# Patient Record
Sex: Male | Born: 1995 | Smoking: Never smoker
Health system: Southern US, Community
[De-identification: ages and names within clinical notes are randomized; demographics above are authoritative.]

## PROBLEM LIST (undated history)

## (undated) DIAGNOSIS — Z973 Presence of spectacles and contact lenses: Secondary | ICD-10-CM

## (undated) DIAGNOSIS — R519 Headache, unspecified: Secondary | ICD-10-CM

## (undated) DIAGNOSIS — K589 Irritable bowel syndrome without diarrhea: Secondary | ICD-10-CM

## (undated) DIAGNOSIS — R51 Headache: Secondary | ICD-10-CM

## (undated) DIAGNOSIS — K219 Gastro-esophageal reflux disease without esophagitis: Secondary | ICD-10-CM

## (undated) DIAGNOSIS — M79604 Pain in right leg: Secondary | ICD-10-CM

## (undated) HISTORY — PX: SIGMOIDOSCOPY: SUR1295

## (undated) HISTORY — PX: ESOPHAGOGASTRODUODENOSCOPY: SHX1529

---

## 2018-04-29 ENCOUNTER — Other Ambulatory Visit: Payer: Self-pay | Admitting: Orthopedic Surgery

## 2018-05-01 ENCOUNTER — Encounter (HOSPITAL_COMMUNITY)
Admission: RE | Admit: 2018-05-01 | Discharge: 2018-05-01 | Disposition: A | Discharge status: Home / Self Care | Source: Ambulatory Visit | Attending: Orthopedic Surgery | Admitting: Orthopedic Surgery

## 2018-05-01 ENCOUNTER — Ambulatory Visit (HOSPITAL_COMMUNITY)
Admission: RE | Admit: 2018-05-01 | Discharge: 2018-05-01 | Disposition: A | Discharge status: Home / Self Care | Payer: Self-pay | Source: Ambulatory Visit | Attending: Orthopedic Surgery | Admitting: Orthopedic Surgery

## 2018-05-01 ENCOUNTER — Encounter (HOSPITAL_COMMUNITY): Payer: Self-pay

## 2018-05-01 ENCOUNTER — Other Ambulatory Visit: Payer: Self-pay

## 2018-05-01 DIAGNOSIS — Z8249 Family history of ischemic heart disease and other diseases of the circulatory system: Secondary | ICD-10-CM | POA: Diagnosis not present

## 2018-05-01 DIAGNOSIS — M5116 Intervertebral disc disorders with radiculopathy, lumbar region: Secondary | ICD-10-CM | POA: Diagnosis not present

## 2018-05-01 DIAGNOSIS — Z79899 Other long term (current) drug therapy: Secondary | ICD-10-CM | POA: Insufficient documentation

## 2018-05-01 DIAGNOSIS — M79604 Pain in right leg: Secondary | ICD-10-CM | POA: Diagnosis present

## 2018-05-01 DIAGNOSIS — K219 Gastro-esophageal reflux disease without esophagitis: Secondary | ICD-10-CM | POA: Insufficient documentation

## 2018-05-01 DIAGNOSIS — Z01818 Encounter for other preprocedural examination: Secondary | ICD-10-CM | POA: Insufficient documentation

## 2018-05-01 DIAGNOSIS — Z6837 Body mass index (BMI) 37.0-37.9, adult: Secondary | ICD-10-CM | POA: Diagnosis not present

## 2018-05-01 HISTORY — DX: Gastro-esophageal reflux disease without esophagitis: K21.9

## 2018-05-01 HISTORY — DX: Headache: R51

## 2018-05-01 HISTORY — DX: Presence of spectacles and contact lenses: Z97.3

## 2018-05-01 HISTORY — DX: Irritable bowel syndrome without diarrhea: K58.9

## 2018-05-01 HISTORY — DX: Headache, unspecified: R51.9

## 2018-05-01 HISTORY — DX: Pain in right leg: M79.604

## 2018-05-01 LAB — URINALYSIS, ROUTINE W REFLEX MICROSCOPIC
Bilirubin Urine: NEGATIVE
Glucose, UA: NEGATIVE mg/dL
Hgb urine dipstick: NEGATIVE
Ketones, ur: NEGATIVE mg/dL
Leukocytes, UA: NEGATIVE
Nitrite: NEGATIVE
Protein, ur: NEGATIVE mg/dL
Specific Gravity, Urine: 1.017 (ref 1.005–1.030)
pH: 5 (ref 5.0–8.0)

## 2018-05-01 LAB — SURGICAL PCR SCREEN
MRSA, PCR: NEGATIVE
STAPHYLOCOCCUS AUREUS: NEGATIVE

## 2018-05-01 LAB — CBC WITH DIFFERENTIAL/PLATELET
ABS IMMATURE GRANULOCYTES: 0.1 10*3/uL (ref 0.0–0.1)
Basophils Absolute: 0.1 10*3/uL (ref 0.0–0.1)
Basophils Relative: 1 %
EOS PCT: 1 %
Eosinophils Absolute: 0.1 10*3/uL (ref 0.0–0.7)
HEMATOCRIT: 45.7 % (ref 39.0–52.0)
HEMOGLOBIN: 15.7 g/dL (ref 13.0–17.0)
Immature Granulocytes: 2 %
LYMPHS PCT: 40 %
Lymphs Abs: 2.9 10*3/uL (ref 0.7–4.0)
MCH: 31.4 pg (ref 26.0–34.0)
MCHC: 34.4 g/dL (ref 30.0–36.0)
MCV: 91.4 fL (ref 78.0–100.0)
MONO ABS: 0.5 10*3/uL (ref 0.1–1.0)
MONOS PCT: 7 %
NEUTROS ABS: 3.7 10*3/uL (ref 1.7–7.7)
Neutrophils Relative %: 49 %
Platelets: 322 10*3/uL (ref 150–400)
RBC: 5 MIL/uL (ref 4.22–5.81)
RDW: 14 % (ref 11.5–15.5)
WBC: 7.4 10*3/uL (ref 4.0–10.5)

## 2018-05-01 LAB — COMPREHENSIVE METABOLIC PANEL WITH GFR
ALT: 11 U/L — ABNORMAL LOW (ref 17–63)
AST: 19 U/L (ref 15–41)
Albumin: 4 g/dL (ref 3.5–5.0)
Alkaline Phosphatase: 84 U/L (ref 38–126)
Anion gap: 7 (ref 5–15)
BUN: 5 mg/dL — ABNORMAL LOW (ref 6–20)
CO2: 24 mmol/L (ref 22–32)
Calcium: 10.2 mg/dL (ref 8.9–10.3)
Chloride: 109 mmol/L (ref 101–111)
Creatinine, Ser: 0.88 mg/dL (ref 0.61–1.24)
GFR calc Af Amer: >60 mL/min
GFR calc non Af Amer: >60 mL/min
Glucose, Bld: 86 mg/dL (ref 65–99)
Potassium: 4.2 mmol/L (ref 3.5–5.1)
Sodium: 140 mmol/L (ref 135–145)
Total Bilirubin: 0.8 mg/dL (ref 0.3–1.2)
Total Protein: 6.8 g/dL (ref 6.5–8.1)

## 2018-05-01 LAB — ABO/RH: ABO/RH(D): O POS

## 2018-05-01 LAB — TYPE AND SCREEN
ABO/RH(D): O POS
Antibody Screen: NEGATIVE

## 2018-05-01 LAB — PROTIME-INR
INR: 0.92
Prothrombin Time: 12.3 s (ref 11.4–15.2)

## 2018-05-01 LAB — APTT: aPTT: 30 seconds (ref 24–36)

## 2018-05-01 NOTE — Progress Notes (Signed)
Pt denies SOB, chest pain, and being under the care of a cardiologist. Pt denies having a stress test, echo and cardiac cath. Pt denies having an EKG and chest x ray within the last year. Pt denies recent labs. Please follow up with chest x ray on DOS; results still pending.

## 2018-05-01 NOTE — Pre-Procedure Instructions (Signed)
    Zachary Quinn  05/01/2018     No Pharmacies Listed   Your procedure is scheduled on Thursday, May 02, 2018  Report to [facility] Admitting at 5:30 A.M.  Call this number if you have problems the morning of surgery:  [phone]   Remember:  Do not eat food or drink liquids after midnight tonight  Take these medicines the morning of surgery with A SIP OF WATER : None  Stop taking Aspirin ( unless otherwise instructed by your surgeon), vitamins, fish oil and herbal medications. Do not take any NSAIDs ie: Ibuprofen, Advil, Naproxen (Aleve), Motrin, BC and Goody Powder; stop now.  Do not wear jewelry, make-up or nail polish.  Do not wear lotions, powders, or perfumes, or deodorant.  Do not shave 48 hours prior to surgery.  Men may shave face and neck.  Do not bring valuables to the hospital.  [facility] is not responsible for any belongings or valuables.  Contacts, dentures or bridgework may not be worn into surgery.  Leave your suitcase in the car.  After surgery it may be brought to your room. Patients discharged the day of surgery will not be allowed to drive home.  Special instructions: Shower tonight and the morning of surgery with CHG. Please read over the following fact sheets that you were given. Pain Booklet, Coughing and Deep Breathing and Surgical Site Infection Prevention

## 2018-05-01 NOTE — Progress Notes (Signed)
   05/01/18 1525  OBSTRUCTIVE SLEEP APNEA  Have you ever been diagnosed with sleep apnea through a sleep study? No  Do you snore loudly (loud enough to be heard through closed doors)?  1  Do you often feel tired, fatigued, or sleepy during the daytime (such as falling asleep during driving or talking to someone)? 0  Has anyone observed you stop breathing during your sleep? 0  Do you have, or are you being treated for high blood pressure? 0  BMI more than 35 kg/m2? 1  Age > 50 (1-yes) 0  Neck circumference greater than:Male 16 inches or larger, Male 17inches or larger? 1  Male Gender (Yes=1) 1  Obstructive Sleep Apnea Score 4

## 2018-05-02 ENCOUNTER — Ambulatory Visit (HOSPITAL_COMMUNITY)

## 2018-05-02 ENCOUNTER — Ambulatory Visit (HOSPITAL_COMMUNITY)
Admission: RE | Admit: 2018-05-02 | Discharge: 2018-05-02 | Disposition: A | Discharge status: Home / Self Care | Source: Ambulatory Visit | Attending: Orthopedic Surgery | Admitting: Orthopedic Surgery

## 2018-05-02 ENCOUNTER — Ambulatory Visit (HOSPITAL_COMMUNITY): Admitting: Anesthesiology

## 2018-05-02 ENCOUNTER — Encounter (HOSPITAL_COMMUNITY)
Admission: RE | Disposition: A | Discharge status: Home / Self Care | Payer: Self-pay | Source: Ambulatory Visit | Attending: Orthopedic Surgery

## 2018-05-02 ENCOUNTER — Encounter (HOSPITAL_COMMUNITY): Payer: Self-pay | Admitting: *Deleted

## 2018-05-02 DIAGNOSIS — Z6837 Body mass index (BMI) 37.0-37.9, adult: Secondary | ICD-10-CM | POA: Diagnosis not present

## 2018-05-02 DIAGNOSIS — Z8249 Family history of ischemic heart disease and other diseases of the circulatory system: Secondary | ICD-10-CM | POA: Diagnosis not present

## 2018-05-02 DIAGNOSIS — M5116 Intervertebral disc disorders with radiculopathy, lumbar region: Secondary | ICD-10-CM | POA: Insufficient documentation

## 2018-05-02 DIAGNOSIS — Z419 Encounter for procedure for purposes other than remedying health state, unspecified: Secondary | ICD-10-CM

## 2018-05-02 HISTORY — PX: LUMBAR LAMINECTOMY/DECOMPRESSION MICRODISCECTOMY: SHX5026

## 2018-05-02 SURGERY — LUMBAR LAMINECTOMY/DECOMPRESSION MICRODISCECTOMY
Anesthesia: General

## 2018-05-02 MED ORDER — ONDANSETRON HCL 4 MG/2ML IJ SOLN: 4.0000 mg | Freq: Once | INTRAMUSCULAR | Status: DC | PRN

## 2018-05-02 MED ORDER — CEFAZOLIN SODIUM 1 G IJ SOLR
INTRAMUSCULAR | Status: AC
  Filled 2018-05-02: qty 10

## 2018-05-02 MED ORDER — METHYLENE BLUE 0.5 % INJ SOLN
INTRAVENOUS | Status: AC
  Filled 2018-05-02: qty 10

## 2018-05-02 MED ORDER — LIDOCAINE 2% (20 MG/ML) 5 ML SYRINGE
INTRAMUSCULAR | Status: AC
  Filled 2018-05-02: qty 5

## 2018-05-02 MED ORDER — MIDAZOLAM HCL 2 MG/2ML IJ SOLN
INTRAMUSCULAR | Status: AC
  Filled 2018-05-02: qty 2

## 2018-05-02 MED ORDER — ONDANSETRON HCL 4 MG/2ML IJ SOLN
INTRAMUSCULAR | Status: AC
  Filled 2018-05-02: qty 2

## 2018-05-02 MED ORDER — FENTANYL CITRATE (PF) 250 MCG/5ML IJ SOLN
INTRAMUSCULAR | Status: AC
  Filled 2018-05-02: qty 5

## 2018-05-02 MED ORDER — MEPERIDINE HCL 50 MG/ML IJ SOLN: 6.2500 mg | INTRAMUSCULAR | Status: DC | PRN

## 2018-05-02 MED ORDER — HYDROMORPHONE HCL 2 MG/ML IJ SOLN
INTRAMUSCULAR | Status: AC
  Filled 2018-05-02: qty 1

## 2018-05-02 MED ORDER — METHYLPREDNISOLONE ACETATE 40 MG/ML IJ SUSP
INTRAMUSCULAR | Status: AC
  Filled 2018-05-02: qty 1

## 2018-05-02 MED ORDER — BUPIVACAINE LIPOSOME 1.3 % IJ SUSP
20.0000 mL | INTRAMUSCULAR | Status: DC
  Filled 2018-05-02: qty 20

## 2018-05-02 MED ORDER — BUPIVACAINE-EPINEPHRINE (PF) 0.25% -1:200000 IJ SOLN
INTRAMUSCULAR | Status: AC
  Filled 2018-05-02: qty 30

## 2018-05-02 MED ORDER — 0.9 % SODIUM CHLORIDE (POUR BTL) OPTIME
TOPICAL | Status: DC | PRN
  Administered 2018-05-02: 1000 mL

## 2018-05-02 MED ORDER — THROMBIN (RECOMBINANT) 20000 UNITS EX SOLR
CUTANEOUS | Status: DC | PRN
  Administered 2018-05-02: 08:00:00 via TOPICAL

## 2018-05-02 MED ORDER — LIDOCAINE HCL (CARDIAC) PF 100 MG/5ML IV SOSY
PREFILLED_SYRINGE | INTRAVENOUS | Status: DC | PRN
  Administered 2018-05-02: 100 mg via INTRAVENOUS

## 2018-05-02 MED ORDER — ROCURONIUM BROMIDE 10 MG/ML (PF) SYRINGE
PREFILLED_SYRINGE | INTRAVENOUS | Status: AC
  Filled 2018-05-02: qty 5

## 2018-05-02 MED ORDER — PHENYLEPHRINE 40 MCG/ML (10ML) SYRINGE FOR IV PUSH (FOR BLOOD PRESSURE SUPPORT)
PREFILLED_SYRINGE | INTRAVENOUS | Status: AC
  Filled 2018-05-02: qty 10

## 2018-05-02 MED ORDER — METHYLPREDNISOLONE ACETATE 40 MG/ML IJ SUSP
INTRAMUSCULAR | Status: DC | PRN
  Administered 2018-05-02: 40 mg

## 2018-05-02 MED ORDER — FENTANYL CITRATE (PF) 100 MCG/2ML IJ SOLN
INTRAMUSCULAR | Status: DC | PRN
  Administered 2018-05-02: 150 ug via INTRAVENOUS
  Administered 2018-05-02: 100 ug via INTRAVENOUS
  Administered 2018-05-02: 50 ug via INTRAVENOUS

## 2018-05-02 MED ORDER — LACTATED RINGERS IV SOLN
INTRAVENOUS | Status: DC | PRN
  Administered 2018-05-02: 07:00:00 via INTRAVENOUS

## 2018-05-02 MED ORDER — SUGAMMADEX SODIUM 500 MG/5ML IV SOLN
INTRAVENOUS | Status: DC | PRN
  Administered 2018-05-02: 300 mg via INTRAVENOUS

## 2018-05-02 MED ORDER — ROCURONIUM BROMIDE 100 MG/10ML IV SOLN
INTRAVENOUS | Status: DC | PRN
  Administered 2018-05-02: 30 mg via INTRAVENOUS
  Administered 2018-05-02: 10 mg via INTRAVENOUS
  Administered 2018-05-02: 60 mg via INTRAVENOUS

## 2018-05-02 MED ORDER — MIDAZOLAM HCL 5 MG/5ML IJ SOLN
INTRAMUSCULAR | Status: DC | PRN
  Administered 2018-05-02: 2 mg via INTRAVENOUS

## 2018-05-02 MED ORDER — BUPIVACAINE LIPOSOME 1.3 % IJ SUSP
INTRAMUSCULAR | Status: DC | PRN
  Administered 2018-05-02: 20 mL

## 2018-05-02 MED ORDER — SUGAMMADEX SODIUM 500 MG/5ML IV SOLN: INTRAVENOUS | Status: DC | PRN

## 2018-05-02 MED ORDER — ONDANSETRON HCL 4 MG/2ML IJ SOLN
INTRAMUSCULAR | Status: DC | PRN
  Administered 2018-05-02: 4 mg via INTRAVENOUS

## 2018-05-02 MED ORDER — SUGAMMADEX SODIUM 500 MG/5ML IV SOLN
INTRAVENOUS | Status: AC
  Filled 2018-05-02: qty 5

## 2018-05-02 MED ORDER — PROPOFOL 10 MG/ML IV BOLUS
INTRAVENOUS | Status: DC | PRN
  Administered 2018-05-02: 120 mg via INTRAVENOUS

## 2018-05-02 MED ORDER — POVIDONE-IODINE 7.5 % EX SOLN
Freq: Once | CUTANEOUS | Status: DC
  Filled 2018-05-02: qty 118

## 2018-05-02 MED ORDER — PROPOFOL 10 MG/ML IV BOLUS
INTRAVENOUS | Status: AC
  Filled 2018-05-02: qty 40

## 2018-05-02 MED ORDER — CEFAZOLIN SODIUM-DEXTROSE 2-4 GM/100ML-% IV SOLN
2.0000 g | INTRAVENOUS | Status: AC
  Administered 2018-05-02: 1 g via INTRAVENOUS
  Administered 2018-05-02: 2 g via INTRAVENOUS
  Filled 2018-05-02: qty 100

## 2018-05-02 MED ORDER — HYDROMORPHONE HCL 2 MG/ML IJ SOLN
0.2500 mg | INTRAMUSCULAR | Status: DC | PRN
  Administered 2018-05-02: 0.5 mg via INTRAVENOUS

## 2018-05-02 MED ORDER — THROMBIN (RECOMBINANT) 20000 UNITS EX SOLR
CUTANEOUS | Status: AC
  Filled 2018-05-02: qty 20000

## 2018-05-02 MED ORDER — PHENYLEPHRINE HCL 10 MG/ML IJ SOLN
INTRAMUSCULAR | Status: DC | PRN
  Administered 2018-05-02: 80 ug via INTRAVENOUS

## 2018-05-02 MED ORDER — BUPIVACAINE-EPINEPHRINE 0.25% -1:200000 IJ SOLN
INTRAMUSCULAR | Status: DC | PRN
  Administered 2018-05-02: 30 mL

## 2018-05-02 MED ORDER — METHYLENE BLUE 0.5 % INJ SOLN
INTRAVENOUS | Status: DC | PRN
  Administered 2018-05-02: .5 mL via INTRADERMAL

## 2018-05-02 SURGICAL SUPPLY — 70 items
BENZOIN TINCTURE PRP APPL 2/3 (GAUZE/BANDAGES/DRESSINGS) ×3 IMPLANT
BUR ROUND PRECISION 4.0 (BURR) ×2 IMPLANT
BUR ROUND PRECISION 4.0MM (BURR) ×1
CANISTER SUCT 3000ML PPV (MISCELLANEOUS) ×3 IMPLANT
CARTRIDGE OIL MAESTRO DRILL (MISCELLANEOUS) ×1 IMPLANT
CLOSURE WOUND 1/2 X4 (GAUZE/BANDAGES/DRESSINGS) ×1
CORDS BIPOLAR (ELECTRODE) ×3 IMPLANT
COVER SURGICAL LIGHT HANDLE (MISCELLANEOUS) ×3 IMPLANT
DIFFUSER DRILL AIR PNEUMATIC (MISCELLANEOUS) ×3 IMPLANT
DRAIN CHANNEL 15F RND FF W/TCR (WOUND CARE) IMPLANT
DRAPE POUCH INSTRU U-SHP 10X18 (DRAPES) ×6 IMPLANT
DRAPE SURG 17X23 STRL (DRAPES) ×12 IMPLANT
DURAPREP 26ML APPLICATOR (WOUND CARE) ×3 IMPLANT
ELECT BLADE 4.0 EZ CLEAN MEGAD (MISCELLANEOUS) ×3
ELECT CAUTERY BLADE 6.4 (BLADE) ×3 IMPLANT
ELECT REM PT RETURN 9FT ADLT (ELECTROSURGICAL) ×3
ELECTRODE BLDE 4.0 EZ CLN MEGD (MISCELLANEOUS) ×1 IMPLANT
ELECTRODE REM PT RTRN 9FT ADLT (ELECTROSURGICAL) ×1 IMPLANT
EVACUATOR SILICONE 100CC (DRAIN) IMPLANT
FILTER STRAW FLUID ASPIR (MISCELLANEOUS) ×3 IMPLANT
GAUZE SPONGE 4X4 12PLY STRL (GAUZE/BANDAGES/DRESSINGS) ×3 IMPLANT
GAUZE SPONGE 4X4 16PLY XRAY LF (GAUZE/BANDAGES/DRESSINGS) ×6 IMPLANT
GLOVE BIO SURGEON STRL SZ7 (GLOVE) ×3 IMPLANT
GLOVE BIO SURGEON STRL SZ8 (GLOVE) ×3 IMPLANT
GLOVE BIOGEL PI IND STRL 7.0 (GLOVE) ×1 IMPLANT
GLOVE BIOGEL PI IND STRL 8 (GLOVE) ×1 IMPLANT
GLOVE BIOGEL PI INDICATOR 7.0 (GLOVE) ×2
GLOVE BIOGEL PI INDICATOR 8 (GLOVE) ×2
GOWN STRL REUS W/ TWL LRG LVL3 (GOWN DISPOSABLE) ×1 IMPLANT
GOWN STRL REUS W/ TWL XL LVL3 (GOWN DISPOSABLE) ×2 IMPLANT
GOWN STRL REUS W/TWL LRG LVL3 (GOWN DISPOSABLE) ×2
GOWN STRL REUS W/TWL XL LVL3 (GOWN DISPOSABLE) ×4
IV CATH 14GX2 1/4 (CATHETERS) ×3 IMPLANT
KIT BASIN OR (CUSTOM PROCEDURE TRAY) ×3 IMPLANT
KIT POSITION SURG JACKSON T1 (MISCELLANEOUS) ×3 IMPLANT
KIT TURNOVER KIT B (KITS) ×3 IMPLANT
NEEDLE 18GX1X1/2 (RX/OR ONLY) (NEEDLE) ×3 IMPLANT
NEEDLE 22X1 1/2 (OR ONLY) (NEEDLE) ×3 IMPLANT
NEEDLE HYPO 25GX1X1/2 BEV (NEEDLE) ×3 IMPLANT
NEEDLE SPNL 18GX3.5 QUINCKE PK (NEEDLE) ×6 IMPLANT
NS IRRIG 1000ML POUR BTL (IV SOLUTION) ×3 IMPLANT
OIL CARTRIDGE MAESTRO DRILL (MISCELLANEOUS) ×3
PACK LAMINECTOMY ORTHO (CUSTOM PROCEDURE TRAY) ×3 IMPLANT
PACK UNIVERSAL I (CUSTOM PROCEDURE TRAY) ×3 IMPLANT
PAD ARMBOARD 7.5X6 YLW CONV (MISCELLANEOUS) ×6 IMPLANT
PATTIES SURGICAL .5 X.5 (GAUZE/BANDAGES/DRESSINGS) IMPLANT
PATTIES SURGICAL .5 X1 (DISPOSABLE) ×3 IMPLANT
SPONGE INTESTINAL PEANUT (DISPOSABLE) ×3 IMPLANT
SPONGE SURGIFOAM ABS GEL 100 (HEMOSTASIS) ×3 IMPLANT
SPONGE SURGIFOAM ABS GEL SZ50 (HEMOSTASIS) ×3 IMPLANT
STRIP CLOSURE SKIN 1/2X4 (GAUZE/BANDAGES/DRESSINGS) ×2 IMPLANT
SURGIFLO W/THROMBIN 8M KIT (HEMOSTASIS) IMPLANT
SUT MNCRL AB 4-0 PS2 18 (SUTURE) ×3 IMPLANT
SUT VIC AB 0 CT1 18XCR BRD 8 (SUTURE) IMPLANT
SUT VIC AB 0 CT1 27 (SUTURE)
SUT VIC AB 0 CT1 27XBRD ANBCTR (SUTURE) IMPLANT
SUT VIC AB 0 CT1 8-18 (SUTURE)
SUT VIC AB 1 CT1 18XCR BRD 8 (SUTURE) ×1 IMPLANT
SUT VIC AB 1 CT1 8-18 (SUTURE) ×2
SUT VIC AB 2-0 CT2 18 VCP726D (SUTURE) ×3 IMPLANT
SYR 20CC LL (SYRINGE) ×3 IMPLANT
SYR BULB IRRIGATION 50ML (SYRINGE) ×3 IMPLANT
SYR CONTROL 10ML LL (SYRINGE) ×6 IMPLANT
SYR TB 1ML 26GX3/8 SAFETY (SYRINGE) ×6 IMPLANT
SYR TB 1ML LUER SLIP (SYRINGE) ×6 IMPLANT
TAPE CLOTH SURG 6X10 WHT LF (GAUZE/BANDAGES/DRESSINGS) ×3 IMPLANT
TOWEL OR 17X24 6PK STRL BLUE (TOWEL DISPOSABLE) ×3 IMPLANT
TOWEL OR 17X26 10 PK STRL BLUE (TOWEL DISPOSABLE) ×3 IMPLANT
WATER STERILE IRR 1000ML POUR (IV SOLUTION) ×3 IMPLANT
YANKAUER SUCT BULB TIP NO VENT (SUCTIONS) ×3 IMPLANT

## 2018-05-02 NOTE — Transfer of Care (Signed)
2Immediate Anesthesia Transfer of Care Note  Patient: Zachary Quinn  Procedure(s) Performed: RIGHT SIDED LUMBAR 5 - SACRUM 1 MICRODISECTOMY (N/A )  Patient Location: PACU  Anesthesia Type:General  Level of Consciousness: awake, alert , oriented and sedated  Airway & Oxygen Therapy: Patient Spontanous Breathing and Patient connected to nasal cannula oxygen  Post-op Assessment: Report given to RN, Post -op Vital signs reviewed and stable and Patient moving all extremities  Post vital signs: Reviewed and stable  Last Vitals:  Vitals Value Taken Time  BP 148/80 05/02/2018 10:22 AM  Temp    Pulse 90 05/02/2018 10:25 AM  Resp 23 05/02/2018 10:25 AM  SpO2 98 % 05/02/2018 10:25 AM  Vitals shown include unvalidated device data.  Last Pain:  Vitals:   05/02/18 0603  TempSrc:   PainSc: 0-No pain         Complications: No apparent anesthesia complications

## 2018-05-02 NOTE — H&P (Signed)
     PREOPERATIVE H&P  Chief Complaint: Right leg paPascual Mantelameron Quinn is a 22 y.o. male who presents with ongoing pain in the right leg. Pain has been chronic, occurring at the time of a work related injury over a year ago  MRI reveals a broad based chronic right L5/S1 HNP, minimally compressing the right S1 nerve  Patient has failed multiple forms of conservative care and continues to have pain (see office notes for additional details regarding the patient's full course of treatment)  Past Medical History:  Diagnosis Date  . GERD (gastroesophageal reflux disease)   . Headache   . IBS (irritable bowel syndrome)   . Right leg pain   . Wears glasses    Past Surgical History:  Procedure Laterality Date  . ESOPHAGOGASTRODUODENOSCOPY    . SIGMOIDOSCOPY     Social History   Socioeconomic History  . Marital status: Unknown    Spouse name: Not on file  . Number of children: Not on file  . Years of education: Not on file  . Highest education level: Not on file  Occupational History  . Not on file  Social Needs  . Financial resource strain: Not on file  . Food insecurity:    Worry: Not on file    Inability: Not on file  . Transportation needs:    Medical: Not on file    Non-medical: Not on file  Tobacco Use  . Smoking status: Never Smoker  . Smokeless tobacco: Never Used  Substance and Sexual Activity  . Alcohol use: Not Currently  . Drug use: Not Currently  . Sexual activity: Not on file  Lifestyle  . Physical activity:    Days per week: Not on file    Minutes per session: Not on file  . Stress: Not on file  Relationships  . Social connections:    Talks on phone: Not on file    Gets together: Not on file    Attends religious service: Not on file    Active member of club or organization: Not on file    Attends meetings of clubs or organizations: Not on file    Relationship status: Not on file  Other Topics Concern  . Not on file  Social History Narrative    . Not on file   Family History  Problem Relation Age of Onset  . Hypertension Mother   . Diabetes Father    No Known Allergies Prior to Admission medications   Not on File     All other systems have been reviewed and were otherwise negative with the exception of those mentioned in the HPI and as above.  Physical Exam: Vitals:   05/02/18 0545 05/02/18 0546  BP:  (!) 143/88  Pulse: 65   Resp: 20   Temp: 97.7 F (36.5 C)   SpO2: 98%     There is no height or weight on file to calculate BMI.  General: Alert, no acute distress Cardiovascular: No pedal edema Respiratory: No cyanosis, no use of accessory musculature Skin: No lesions in the area of chief complaint Neurologic: Sensation intact distally Psychiatric: Patient is competent for consent with normal mood and affect Lymphatic: No axillary or cervical lymphadenopathy  MUSCULOSKELETAL: + SLR on the right  Assessment/Plan: RIGHT LEG PAIN Plan for Procedure(s): RIGHT SIDED LUMBAR 5 - SACRUM 1 MICRODISECTOMY   Emilee Hero, MD 05/02/2018 6:42 AM

## 2018-05-02 NOTE — Anesthesia Procedure Notes (Signed)
Procedure Name: Intubation Date/Time: 05/02/2018 7:45 AM Performed by: Fransisca Kaufmann, CRNA Pre-anesthesia Checklist: Patient identified, Emergency Drugs available, Suction available and Patient being monitored Patient Re-evaluated:Patient Re-evaluated prior to induction Oxygen Delivery Method: Circle System Utilized Preoxygenation: Pre-oxygenation with 100% oxygen Induction Type: IV induction Ventilation: Mask ventilation without difficulty Laryngoscope Size: Miller and 2 Grade View: Grade I Tube type: Oral Tube size: 7.5 mm Number of attempts: 1 Airway Equipment and Method: Stylet and Oral airway Placement Confirmation: ETT inserted through vocal cords under direct vision,  positive ETCO2 and breath sounds checked- equal and bilateral Secured at: 23 cm Tube secured with: Tape Dental Injury: Teeth and Oropharynx as per pre-operative assessment

## 2018-05-02 NOTE — Anesthesia Postprocedure Evaluation (Signed)
Anesthesia Post Note  Patient: Zachary Quinn  Procedure(s) Performed: RIGHT SIDED LUMBAR 5 - SACRUM 1 MICRODISECTOMY (N/A )     Patient location during evaluation: PACU Anesthesia Type: General Level of consciousness: awake and alert Pain management: pain level controlled Vital Signs Assessment: post-procedure vital signs reviewed and stable Respiratory status: spontaneous breathing, nonlabored ventilation, respiratory function stable and patient connected to nasal cannula oxygen Cardiovascular status: blood pressure returned to baseline and stable Postop Assessment: no apparent nausea or vomiting Anesthetic complications: no    Last Vitals:  Vitals:   05/02/18 1200 05/02/18 1221  BP:  (!) 150/96  Pulse: 77 77  Resp: 13 16  Temp: 36.9 C   SpO2: 100% 100%    Last Pain:  Vitals:   05/02/18 1200  TempSrc:   PainSc: 1                  Latifa Noble DAVID

## 2018-05-02 NOTE — Op Note (Signed)
NAME:  Zachary Quinn          MEDICAL RECORD NO.:  [id number]  PHYSICIAN:  Estill Bamberg, MD      DATE OF BIRTH:  1996-09-15  DATE OF PROCEDURE:  05/02/2018                              OPERATIVE REPORT   PREOPERATIVE DIAGNOSES: 1. Right-sided S1 radiculopathy. 2. 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 3. Morbid obesity  POSTOPERATIVE DIAGNOSES: 1. Right-sided S1 radiculopathy. 2. 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 3. Morbid obesity  PROCEDURES:  Right-sided L5-S1 laminotomy with partial facetectomy and removal of chronic herniated right-sided L5-S1 disk fragment.  SURGEON:  Estill Bamberg, MD.  ASSISTANTJason Coop, PA-C.  ANESTHESIA:  General endotracheal anesthesia.  COMPLICATIONS:  None.  DISPOSITION:  Stable.  ESTIMATED BLOOD LOSS:  Minimal.  INDICATIONS FOR SURGERY:  Briefly, Zachary Quinn is a pleasant 22 year old patient, who did present to me with ongoing pain in the right leg following a work injury that occurred on 07/25/2016.  The patient's MRI did reveal the findings outlined above, clearly notable for a broad based herniated disk fragment. The pain was rather severe.  We did discuss treatment options and we did ultimately elect to proceed with the procedure reflected above.    The surgery was pending approval for many months, and ultimately, his surgery was approved.  An updated MRI was obtained as well, again revealing right-sided L5-S1 disc protrusion, compressing the right S1 nerve.  His symptoms were consistent with this. The patient was fully made aware of the risks of surgery, including the risk of recurrent herniation and the need for subsequent surgery, including the possibility of a subsequent diskectomy and/or fusion.  He did wish to proceed.  Of note, at the time of his surgery, his injury occurred 1 year and 9 months  prior, so clearly, his disc herniation was a chronic one at the time of his surgery.  OPERATIVE DETAILS:  On 05/02/2018, the patient was brought to surgery and general endotracheal anesthesia was administered.  The patient was placed prone on a well-padded flat Jackson bed with a spinal frame.  Antibiotics were given.  The back was prepped and draped and a time-out procedure was performed.  At this point, a midline incision was made directly over the L5-S1 intervertebral space.  A curvilinear incision was made just to the right of the midline into the fascia.    A Taylor retractor was positioned over the lateral aspect of the L5-S1 facet joint, which was held in place using Kerlix.    Of note, the exposure to the L5-S1 intervertebral space, and the remainder of the surgery for that matter, was very meticulous and time-consuming, given the patient's very large body habitus and morbid obesity. The lamina of L5 and S1 was ultimately identified and subperiosteally exposed.  I then removed the lateral aspect of the L5-S1 ligamentum flavum and the medial aspect of the L5-S1 facet joint.  Readily identified was the traversing right S1 nerve, which was noted to be under obvious tension, and was noted to be rather erythematous.  I was able to gently gain medial retraction of the nerve, and in doing so, a chronic appearing broad-based disk fragment was readily noted.    At this point, an annulotomy was  made at the posterior lateral aspect of the L5-S1 disc.  I then used a series of upward angled and straight micro pituitaries to remove chronic protruded disc fragments, located just beneath the annulus.  I was very pleased with the decompression that I was able to accomplish, as no additional tension or compression of the nerve was noted at this point..  The wound was then copiously irrigated with normal saline.  All epidural bleeding was controlled using bipolar electrocautery. All bleeding was controlled at  the termination of the procedure.  At this point, 20 mg of Depo-Medrol was introduced about the epidural space in the region of the right S1 nerve.    Gelfoam was then placed over the laminectomy site.   The wound was then closed in layers using #1 Vicryl followed by 0 Vicryl, followed by 4-0 Monocryl. Benzoin and Steri-Strips were applied followed by a sterile dressing. All instrument counts were correct at the termination of the procedure.  Of note, Jason Coop was my assistant throughout surgery, and did aid in retraction, suctioning, and closure from start to finish.     Estill Bamberg, MD

## 2018-05-02 NOTE — Anesthesia Preprocedure Evaluation (Signed)
Anesthesia Evaluation  Patient identified by MRN, date of birth, ID band Patient awake    Reviewed: Allergy & Precautions, NPO status , Patient's Chart, lab work & pertinent test results  Airway Mallampati: I  TM Distance: >3 FB Neck ROM: Full    Dental   Pulmonary    Pulmonary exam normal        Cardiovascular Normal cardiovascular exam     Neuro/Psych    GI/Hepatic GERD  Medicated and Controlled,  Endo/Other    Renal/GU      Musculoskeletal   Abdominal   Peds  Hematology   Anesthesia Other Findings   Reproductive/Obstetrics                             Anesthesia Physical Anesthesia Plan  ASA: II  Anesthesia Plan: General   Post-op Pain Management:    Induction: Intravenous  PONV Risk Score and Plan: 2 and Ondansetron, Treatment may vary due to age or medical condition, Dexamethasone and Midazolam  Airway Management Planned: Oral ETT  Additional Equipment:   Intra-op Plan:   Post-operative Plan: Extubation in OR  Informed Consent: I have reviewed the patients History and Physical, chart, labs and discussed the procedure including the risks, benefits and alternatives for the proposed anesthesia with the patient or authorized representative who has indicated his/her understanding and acceptance.     Plan Discussed with: CRNA and Surgeon  Anesthesia Plan Comments:         Anesthesia Quick Evaluation

## 2018-05-03 ENCOUNTER — Encounter (HOSPITAL_COMMUNITY): Payer: Self-pay | Admitting: Orthopedic Surgery

## 2019-08-08 IMAGING — CR DG LUMBAR SPINE 2-3V
2 series · 2 of 2 positions shown · non-contrast
Comparison: None.

CLINICAL DATA: L5-S1 microdiscectomy.

EXAM:
LUMBAR SPINE - 2-3 VIEW

[lateral (1 of 2)]
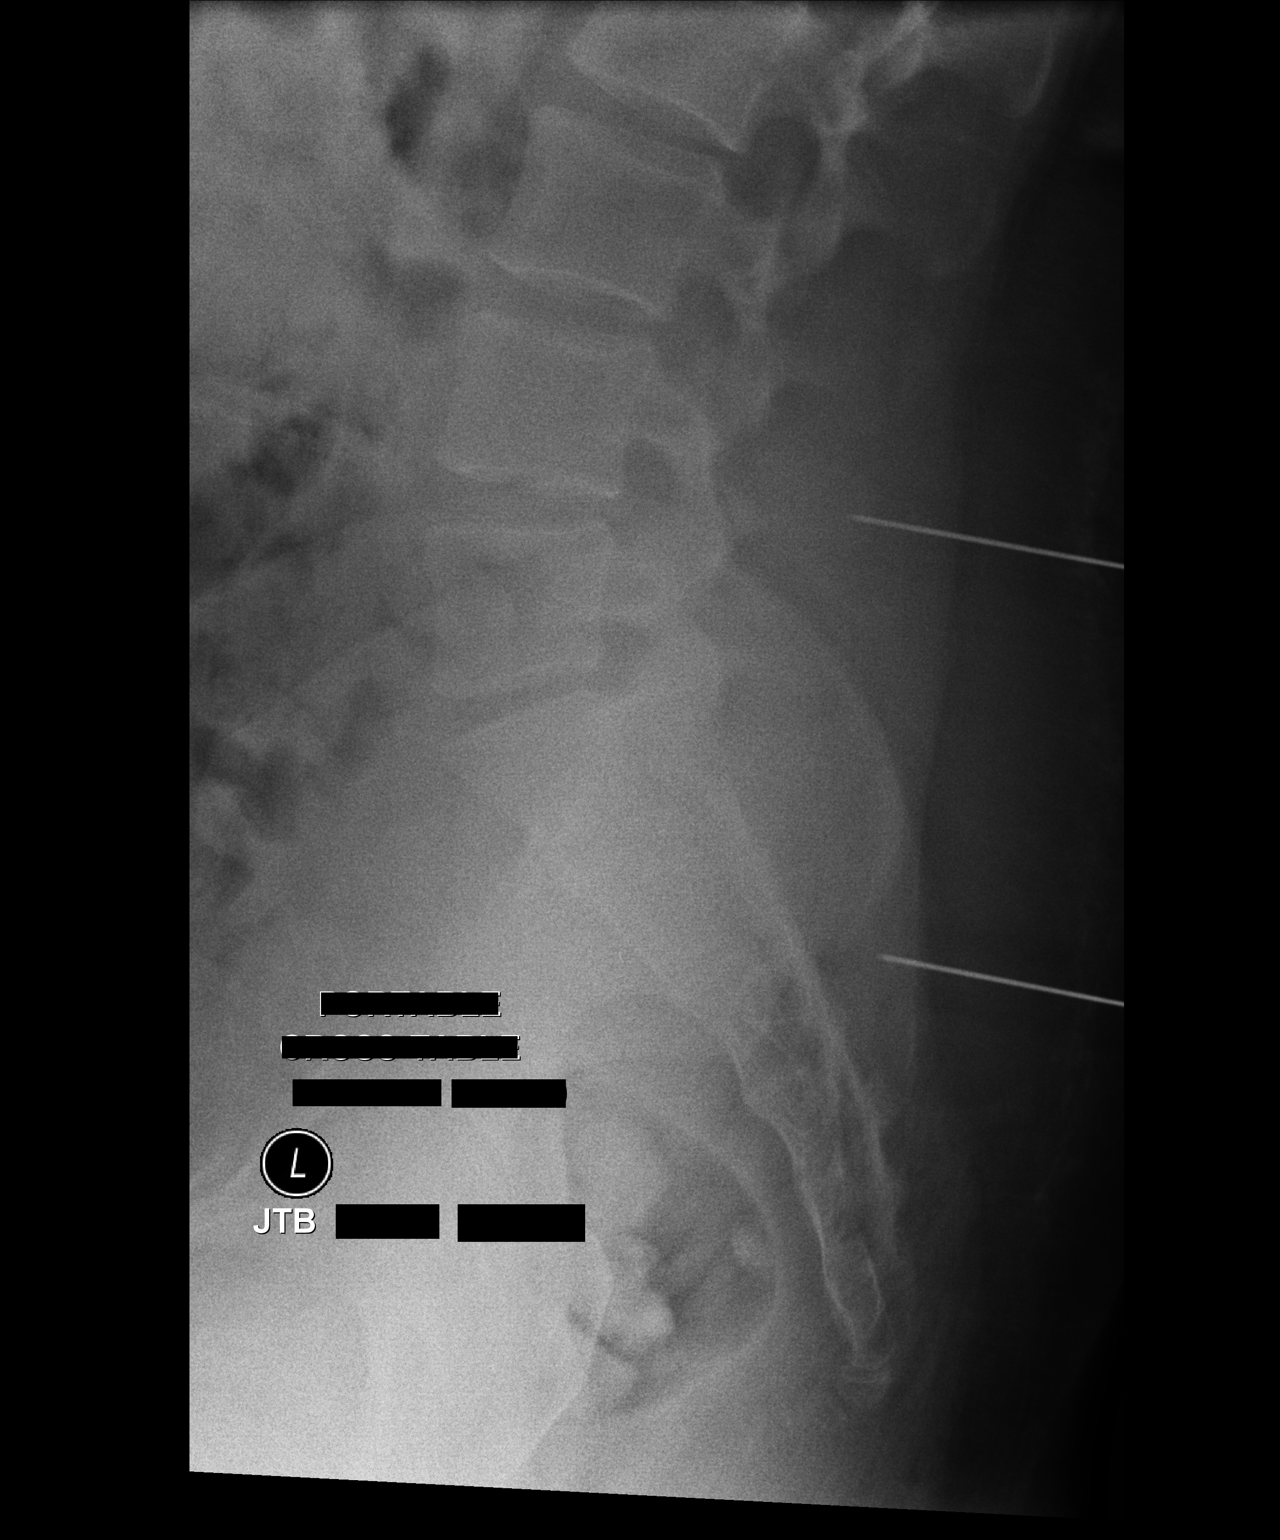

[lateral (2 of 2)]
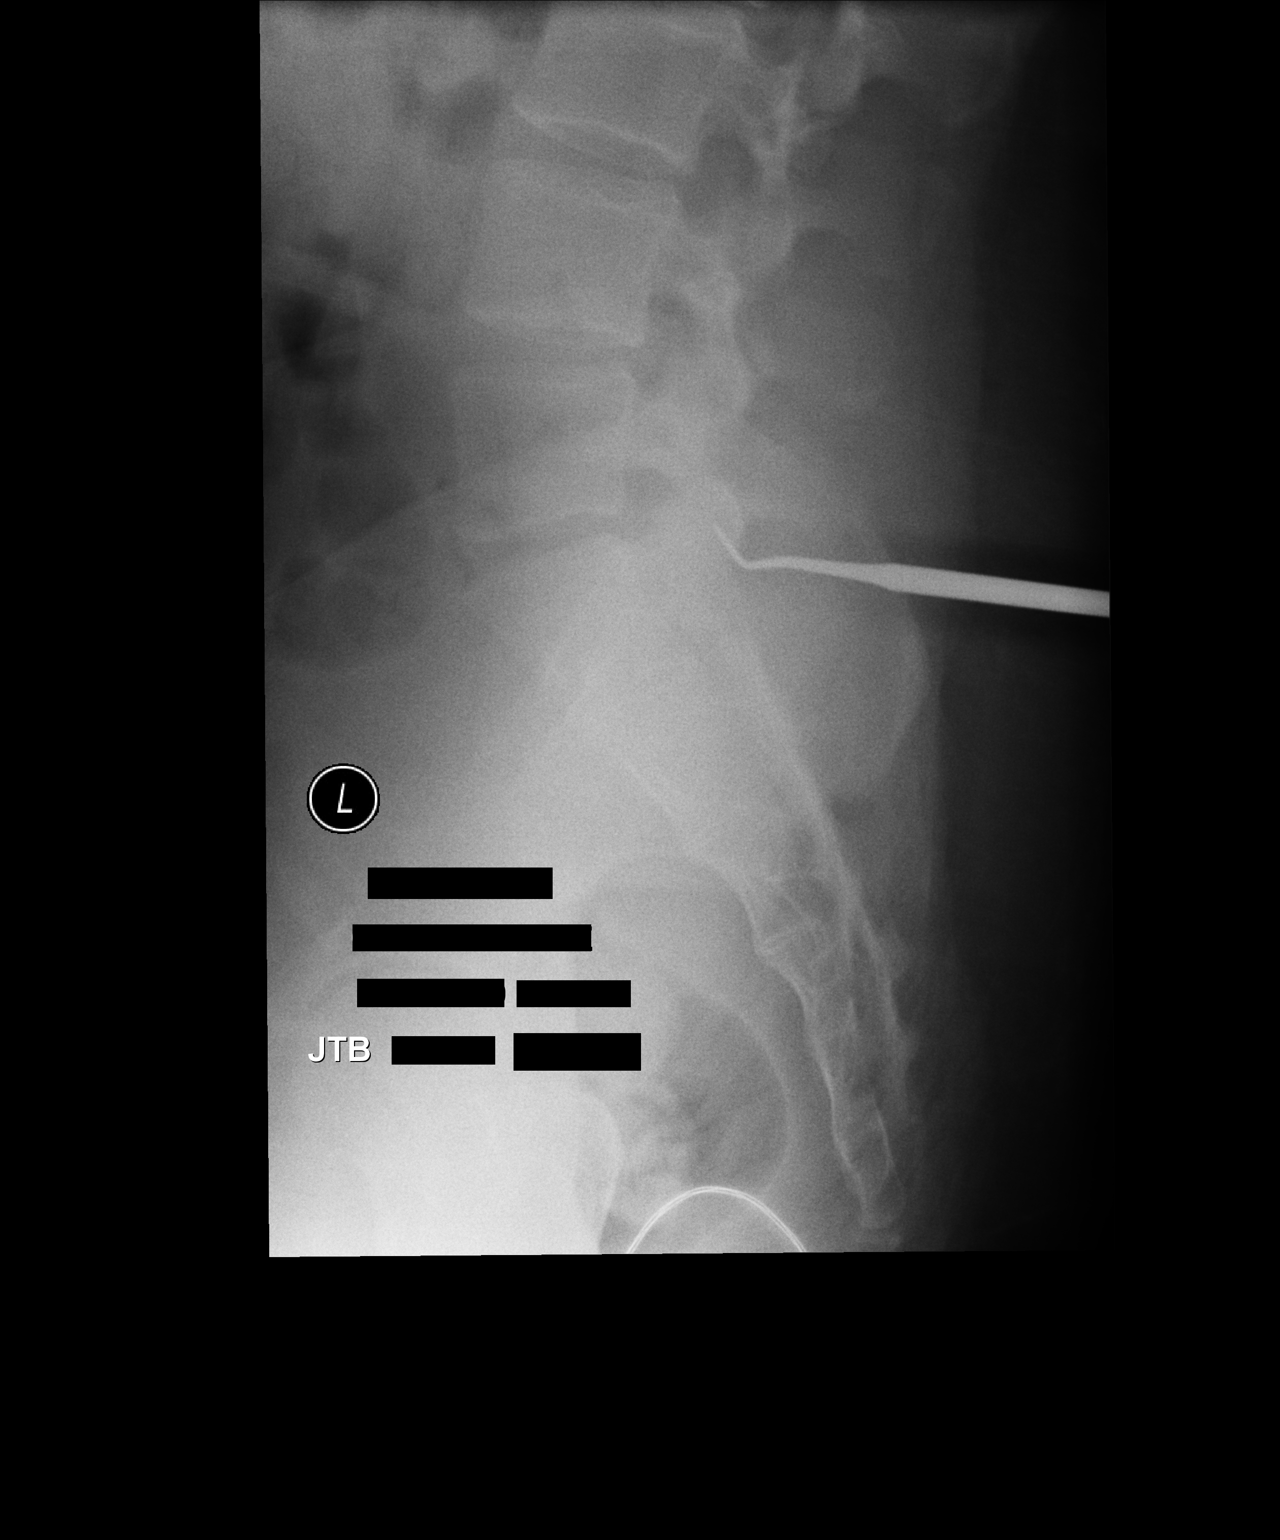

[2 of 2 positions shown; findings below may reference images not displayed]

FINDINGS: Two lateral intraoperative x-rays are submitted. Film #1
demonstrates surgical instruments over the L4 spinous process at the
level of L4-L5 and more inferiorly near the S2-S3 level.

Film #2 demonstrates surgical instrument posteriorly at L5-S1.

No acute abnormality.  Alignment is normal.
IMPRESSION: Intraoperative localization as above.
